# Patient Record
Sex: Male | Born: 2000 | Race: Black or African American | Hispanic: No | Marital: Single | Smoking: Never smoker
Health system: Southern US, Community
[De-identification: ages and names within clinical notes are randomized; demographics above are authoritative.]

## PROBLEM LIST (undated history)

## (undated) DIAGNOSIS — J45909 Unspecified asthma, uncomplicated: Secondary | ICD-10-CM

## (undated) HISTORY — PX: MENISCUS REPAIR: SHX5179

## (undated) HISTORY — PX: ANTERIOR CRUCIATE LIGAMENT REPAIR: SHX115

---

## 2021-08-06 ENCOUNTER — Emergency Department (HOSPITAL_BASED_OUTPATIENT_CLINIC_OR_DEPARTMENT_OTHER)
Admission: EM | Admit: 2021-08-06 | Discharge: 2021-08-06 | Disposition: A | Discharge status: Home / Self Care | Payer: Medicaid - Out of State | Attending: Emergency Medicine | Admitting: Emergency Medicine

## 2021-08-06 ENCOUNTER — Encounter (HOSPITAL_BASED_OUTPATIENT_CLINIC_OR_DEPARTMENT_OTHER): Payer: Self-pay | Admitting: Urology

## 2021-08-06 DIAGNOSIS — J45909 Unspecified asthma, uncomplicated: Secondary | ICD-10-CM | POA: Insufficient documentation

## 2021-08-06 DIAGNOSIS — H9201 Otalgia, right ear: Secondary | ICD-10-CM | POA: Diagnosis present

## 2021-08-06 DIAGNOSIS — R0981 Nasal congestion: Secondary | ICD-10-CM | POA: Insufficient documentation

## 2021-08-06 HISTORY — DX: Unspecified asthma, uncomplicated: J45.909

## 2021-08-06 MED ORDER — DEXAMETHASONE 4 MG PO TABS
10.0000 mg | ORAL_TABLET | Freq: Once | ORAL | Status: AC
  Administered 2021-08-06: 10 mg via ORAL

## 2021-08-06 NOTE — ED Provider Notes (Signed)
[facility] EMERGENCY DEPT Provider Note   CSN: [id number] Arrival date & time: 08/06/21  1903     History Chief Complaint  Patient presents with   Otalgia    Cory Meza. is a 20 y.o. male.  The history is provided by the patient.  Otalgia Location:  Right Behind ear:  No abnormality Quality:  Aching Severity:  Mild Timing:  Constant Chronicity:  New Context: not recent URI   Relieved by:  Nothing Worsened by:  Nothing Associated symptoms: congestion   Associated symptoms: no ear discharge, no fever, no headaches, no sore throat and no tinnitus       Past Medical History:  Diagnosis Date   Asthma     There are no problems to display for this patient.   History reviewed. No pertinent surgical history.     History reviewed. No pertinent family history.  Social History   Tobacco Use   Smoking status: Never   Smokeless tobacco: Never  Substance Use Topics   Alcohol use: Never   Drug use: Never    Home Medications Prior to Admission medications   Not on File    Allergies    Patient has no allergy information on record.  Review of Systems   Review of Systems  Constitutional:  Negative for fever.  HENT:  Positive for congestion, ear pain, sinus pressure and sinus pain. Negative for dental problem, drooling, ear discharge, facial swelling, sore throat, tinnitus and trouble swallowing.   Neurological:  Negative for headaches.   Physical Exam Updated Vital Signs BP (!) 152/72 (BP Location: Right Arm)   Pulse 89   Temp 98.8 F (37.1 C) (Oral)   Resp 18   Ht 5\' 7"  (1.702 m)   Wt 136.1 kg   SpO2 97%   BMI 46.99 kg/m   Physical Exam HENT:     Head: Normocephalic and atraumatic.     Right Ear: Tympanic membrane and external ear normal. There is no impacted cerumen.     Left Ear: Tympanic membrane and external ear normal. There is no impacted cerumen.     Nose: Congestion present.     Mouth/Throat:     Mouth: Mucous  membranes are moist.     Pharynx: No oropharyngeal exudate or posterior oropharyngeal erythema.  Eyes:     General:        Right eye: No discharge.        Left eye: No discharge.     Pupils: Pupils are equal, round, and reactive to light.  Musculoskeletal:     Cervical back: Normal range of motion.  Neurological:     Mental Status: He is alert.    ED Results / Procedures / Treatments   Labs (all labs ordered are listed, but only abnormal results are displayed) Labs Reviewed - No data to display  EKG None  Radiology No results found.  Procedures Procedures   Medications Ordered in ED Medications  dexamethasone (DECADRON) tablet 10 mg (10 mg Oral Given 08/06/21 2123)    ED Course  I have reviewed the triage vital signs and the nursing notes.  Pertinent labs & imaging results that were available during my care of the patient were reviewed by me and considered in my medical decision making (see chart for details).    MDM Rules/Calculators/A&P                           [place] [address]89  Jr. is here with right ear pain.  Normal vitals.  No fever.  Ear exam bilaterally is normal.  There is no signs of infection.  He has had some sinus congestion.  Overall suspect sinus congestion causing ear discomfort.  Gave a dose of Decadron.  Patient did not want COVID test.  Suspect either allergies versus viral process.  Suspect that this will get better with time.  Discharged in good condition.  Understands return precautions.  This chart was dictated using voice recognition software.  Despite best efforts to proofread,  errors can occur which can change the documentation meaning.   Final Clinical Impression(s) / ED Diagnoses Final diagnoses:  Right ear pain    Rx / DC Orders ED Discharge Orders     None        Virgina Norfolk, DO 08/06/21 2128

## 2021-08-06 NOTE — Discharge Instructions (Signed)
Overall I believe your ear pain is secondary to sinus congestion.  There is no evidence of ear infection or earwax on your exam.  I am hoping that steroid dose will help relieve the pressure in your eardrums.  You have been given a long-acting steroid that should help with your discomfort over the next several days.

## 2021-08-06 NOTE — ED Triage Notes (Signed)
Pt states right ear pain and fullness since yesterday.  Used OTC ear drops with no relief. Denies drainage

## 2021-12-04 ENCOUNTER — Emergency Department (HOSPITAL_COMMUNITY): Payer: Medicaid - Out of State

## 2021-12-04 ENCOUNTER — Emergency Department (HOSPITAL_COMMUNITY)
Admission: EM | Admit: 2021-12-04 | Discharge: 2021-12-05 | Disposition: A | Discharge status: Home / Self Care | Payer: Medicaid - Out of State | Attending: Emergency Medicine | Admitting: Emergency Medicine

## 2021-12-04 DIAGNOSIS — S161XXA Strain of muscle, fascia and tendon at neck level, initial encounter: Secondary | ICD-10-CM | POA: Diagnosis not present

## 2021-12-04 DIAGNOSIS — S0990XA Unspecified injury of head, initial encounter: Secondary | ICD-10-CM | POA: Diagnosis present

## 2021-12-04 DIAGNOSIS — S299XXA Unspecified injury of thorax, initial encounter: Secondary | ICD-10-CM | POA: Diagnosis not present

## 2021-12-04 DIAGNOSIS — G44319 Acute post-traumatic headache, not intractable: Secondary | ICD-10-CM | POA: Insufficient documentation

## 2021-12-04 DIAGNOSIS — Y92410 Unspecified street and highway as the place of occurrence of the external cause: Secondary | ICD-10-CM | POA: Diagnosis not present

## 2021-12-04 MED ORDER — ACETAMINOPHEN 325 MG PO TABS
650.0000 mg | ORAL_TABLET | Freq: Once | ORAL | Status: AC
  Administered 2021-12-05: 650 mg via ORAL
  Filled 2021-12-04: qty 2
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
## 2021-12-05 ENCOUNTER — Emergency Department (HOSPITAL_COMMUNITY): Payer: Medicaid - Out of State

## 2021-12-05 MED ORDER — METHOCARBAMOL 500 MG PO TABS: 500.0000 mg | ORAL_TABLET | Freq: Every evening | ORAL | 0 refills | Status: DC | PRN
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
## 2021-12-05 NOTE — Discharge Instructions (Signed)
Take ibuprofen 3 times a day with meals as needed for pain.  Do not take other anti-inflammatories at the same time (Advil, Motrin, naproxen, Aleve). You may supplement with Tylenol if you need further pain control. °Use robaxin as needed for muscle stiffness or soreness.  Have caution, this may make you tired or groggy.  Do not drive or operate heavy machinery while taking this medicine. °Use ice packs or heating pads if this helps control your pain. °You will likely have continued muscle stiffness and soreness over the next couple days.  Follow-up with primary care in 1 week if your symptoms are not improving. °Return to the emergency room if you develop vision changes, vomiting, slurred speech, numbness, loss of bowel or bladder control, or any new or worsening symptoms. ° °
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
## 2022-09-18 ENCOUNTER — Emergency Department (HOSPITAL_COMMUNITY)
Admission: EM | Admit: 2022-09-18 | Discharge: 2022-09-18 | Disposition: A | Discharge status: Home / Self Care | Payer: Medicaid - Out of State | Attending: Emergency Medicine | Admitting: Emergency Medicine

## 2022-09-18 ENCOUNTER — Other Ambulatory Visit: Payer: Self-pay

## 2022-09-18 ENCOUNTER — Encounter (HOSPITAL_COMMUNITY): Payer: Self-pay | Admitting: Emergency Medicine

## 2022-09-18 DIAGNOSIS — I10 Essential (primary) hypertension: Secondary | ICD-10-CM | POA: Insufficient documentation

## 2022-09-18 DIAGNOSIS — K1329 Other disturbances of oral epithelium, including tongue: Secondary | ICD-10-CM | POA: Insufficient documentation

## 2022-09-18 DIAGNOSIS — G4489 Other headache syndrome: Secondary | ICD-10-CM | POA: Insufficient documentation

## 2022-09-18 LAB — I-STAT CHEM 8, ED
BUN: 12 mg/dL (ref 6–20)
Calcium, Ion: 1.23 mmol/L (ref 1.15–1.40)
Chloride: 104 mmol/L (ref 98–111)
Creatinine, Ser: 0.8 mg/dL (ref 0.61–1.24)
Glucose, Bld: 104 mg/dL — ABNORMAL HIGH (ref 70–99)
HCT: 43 % (ref 39.0–52.0)
Hemoglobin: 14.6 g/dL (ref 13.0–17.0)
Potassium: 4.3 mmol/L (ref 3.5–5.1)
Sodium: 139 mmol/L (ref 135–145)
TCO2: 27 mmol/L (ref 22–32)

## 2022-09-18 MED ORDER — PROCHLORPERAZINE EDISYLATE 10 MG/2ML IJ SOLN
10.0000 mg | Freq: Once | INTRAMUSCULAR | Status: AC
  Administered 2022-09-18: 10 mg via INTRAVENOUS
  Filled 2022-09-18: qty 2

## 2022-09-18 MED ORDER — KETOROLAC TROMETHAMINE 15 MG/ML IJ SOLN
15.0000 mg | Freq: Once | INTRAMUSCULAR | Status: AC
  Administered 2022-09-18: 15 mg via INTRAVENOUS
  Filled 2022-09-18: qty 1

## 2022-09-18 NOTE — ED Provider Notes (Signed)
[facility] [place] [facility]EMERGENCY DEPT Provider Note   CSN: [id number] Arrival date & time: 09/18/22  0559     History  Chief Complaint  Patient presents with   Headache    Taiten T Nefi Musich. is a 21 y.o. male.  The history is provided by the patient.  Patient is a 21 year old male who presents with headache.  Patient reports for the past several months he has frequent headaches.  Reports it typically occurs in the morning and gets improved throughout the day.  No fevers or vomiting.  No new visual changes.  No focal weakness.  No recent head trauma.  He reports when he has a headache he has tongue numbness each time.  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  He reports he is under stress at school and may not be sleeping enough.  Denies any drug or alcohol abuse.  No known family history of CVA or aneurysm.      Home Medications Prior to Admission medications   Not on File      Allergies    Patient has no allergy information on record.    Review of Systems   Review of Systems  Constitutional:  Negative for fever.  Eyes:  Negative for visual disturbance.  Respiratory:  Negative for shortness of breath.   Cardiovascular:  Negative for chest pain.  Neurological:  Positive for headaches. Negative for syncope and weakness.    Physical Exam Updated Vital Signs BP (!) 177/109 (BP Location: Left Arm)   Pulse (!) 103   Temp 98.2 F (36.8 C) (Oral)   Resp 16   Ht 1.702 m (5\' 7" )   Wt (!) 165.6 kg   SpO2 97%   BMI 57.17 kg/m  Physical Exam CONSTITUTIONAL: Well developed/well nourished HEAD: Normocephalic/atraumatic EYES: EOMI/PERRL, no nystagmus, no ptosis ENMT: Mucous membranes moist NECK: supple no meningeal signs SPINE/BACK:entire spine nontender CV: S1/S2 noted, no murmurs/rubs/gallops noted LUNGS: Lungs are clear to auscultation bilaterally, no apparent distress ABDOMEN: soft, nontender, no rebound or guarding GU:no cva  tenderness NEURO:Awake/alert, face symmetric, no arm or leg drift is noted Equal 5/5 strength with shoulder abduction, elbow flex/extension, wrist flex/extension in upper extremities and equal hand grips bilaterally Equal 5/5 strength with hip flexion,knee flex/extension, foot dorsi/plantar flexion Cranial nerves 3/4/5/6/05/31/09/11/12 tested and intact Gait normal without ataxia No past pointing Sensation to light touch intact in all extremities EXTREMITIES: pulses normal, full ROM SKIN: warm, color normal PSYCH: no abnormalities of mood noted, alert and oriented to situation  ED Results / Procedures / Treatments   Labs (all labs ordered are listed, but only abnormal results are displayed) Labs Reviewed  I-STAT CHEM 8, ED - Abnormal; Notable for the following components:      Result Value   Glucose, Bld 104 (*)    All other components within normal limits    EKG None  Radiology No results found.  Procedures Procedures    Medications Ordered in ED Medications  prochlorperazine (COMPAZINE) injection 10 mg (10 mg Intravenous Given 09/18/22 0635)  ketorolac (TORADOL) 15 MG/ML injection 15 mg (15 mg Intravenous Given 09/18/22 09/20/22)    ED Course/ Medical Decision Making/ A&P Clinical Course as of 09/18/22 0655  Fri Sep 18, 2022  0645 Patient reports intermittent headaches for a while, usually happens at night and improves.  Patient is hypertensive here, and when I review his chart most of his ED visits he is typically hypertensive.  This is likely contributing to  his headaches.  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  He already had neuroimaging earlier this year that was unremarkable. [DW]  [phone] Low suspicion  for CVA or other acute neurologic emergency.  We will treat his headache and reassess [DW]  0653 Patient's blood pressure is improving.  His headache is resolved.  He declines blood pressure medicine at this time.  I  strongly encouraged him to follow-up closely to have this rechecked as he will likely need to be on daily medications. [DW]  [phone] We discussed strict return precautions [DW]    Clinical Course User Index [DW] Ripley Fraise, MD                           Medical Decision Making Risk Prescription drug management.   This patient presents to the ED for concern of headache, this involves an extensive number of treatment options, and is a complaint that carries with it a high risk of complications and morbidity.  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  The pertinent results include:  labs are unremarkable  Medicines ordered and prescription drug management: I ordered medication including Compazine and Toradol for headache Reevaluation of the patient after these medicines showed that the patient    improved  Test Considered: I consider neuroimaging however patient has had CT head in the past year that was negative.  Reevaluation: After the interventions noted above, I reevaluated the patient and found that they have :improved  Complexity of problems addressed: Patient's presentation is most consistent with  acute presentation with potential threat to life or bodily function  Disposition: After consideration of the diagnostic results and the patient's response to treatment,  I feel that the  patent would benefit from discharge   .           Final Clinical Impression(s) / ED Diagnoses Final diagnoses:  Other headache syndrome  Uncontrolled hypertension    Rx / DC Orders ED Discharge Orders     None         Ripley Fraise, MD 09/18/22 [phone]

## 2022-09-18 NOTE — Discharge Instructions (Signed)

## 2022-09-18 NOTE — ED Triage Notes (Signed)
Pt c/o headache since 0300 this morning. Pt states that his tongue is numb.

## 2022-12-07 ENCOUNTER — Emergency Department (HOSPITAL_COMMUNITY)
Admission: EM | Admit: 2022-12-07 | Discharge: 2022-12-07 | Disposition: A | Discharge status: Home / Self Care | Payer: Self-pay | Attending: Emergency Medicine | Admitting: Emergency Medicine

## 2022-12-07 ENCOUNTER — Encounter (HOSPITAL_COMMUNITY): Payer: Self-pay

## 2022-12-07 ENCOUNTER — Other Ambulatory Visit: Payer: Self-pay

## 2022-12-07 DIAGNOSIS — J020 Streptococcal pharyngitis: Secondary | ICD-10-CM | POA: Insufficient documentation

## 2022-12-07 DIAGNOSIS — Z1152 Encounter for screening for COVID-19: Secondary | ICD-10-CM | POA: Insufficient documentation

## 2022-12-07 LAB — RESP PANEL BY RT-PCR (RSV, FLU A&B, COVID)  RVPGX2
Influenza A by PCR: NEGATIVE
Influenza B by PCR: NEGATIVE
Resp Syncytial Virus by PCR: NEGATIVE
SARS Coronavirus 2 by RT PCR: NEGATIVE

## 2022-12-07 LAB — GROUP A STREP BY PCR: Group A Strep by PCR: DETECTED — AB

## 2022-12-07 MED ORDER — HYDROCODONE-ACETAMINOPHEN 7.5-325 MG/15ML PO SOLN: ORAL | 0 refills | Status: AC

## 2022-12-07 MED ORDER — PENICILLIN V POTASSIUM 500 MG PO TABS: 500.0000 mg | ORAL_TABLET | Freq: Four times a day (QID) | ORAL | 0 refills | Status: AC

## 2022-12-07 MED ORDER — ACETAMINOPHEN 325 MG PO TABS
650.0000 mg | ORAL_TABLET | Freq: Once | ORAL | Status: AC
  Administered 2022-12-07: 650 mg via ORAL
  Filled 2022-12-07: qty 2

## 2022-12-07 MED ORDER — LIDOCAINE VISCOUS HCL 2 % MT SOLN
15.0000 mL | Freq: Once | OROMUCOSAL | Status: AC
  Administered 2022-12-07: 15 mL via OROMUCOSAL
  Filled 2022-12-07: qty 15

## 2022-12-07 NOTE — Discharge Instructions (Signed)
Drink plenty of fluids.  Follow-up with your family doctor if not improving.  Have your blood pressure checked in the next couple weeks because it is mildly elevated but this could be related to your infection.

## 2022-12-07 NOTE — ED Provider Notes (Signed)
[place] [facility] DEPT Provider Note   CSN: [id number] Arrival date & time: 12/07/22  0751     History  Chief Complaint  Patient presents with   Sore Throat    Cory Meza. is a 22 y.o. male.  Patient complains of a sore throat.  No past medical history  The history is provided by the patient and medical records. No language interpreter was used.  Sore Throat This is a new problem. The problem occurs constantly. The problem has not changed since onset.Pertinent negatives include no chest pain, no abdominal pain and no headaches. Nothing aggravates the symptoms. Nothing relieves the symptoms. He has tried nothing for the symptoms. The treatment provided no relief.       Home Medications Prior to Admission medications   Medication Sig Start Date End Date Taking? Authorizing Provider  HYDROcodone-acetaminophen (HYCET) 7.5-325 mg/15 ml solution 1 teaspoon every 6 hours for severe pain that is not relieved by Tylenol or Motrin alone 12/07/22  Yes Milton Ferguson, MD  penicillin v potassium (VEETID) 500 MG tablet Take 1 tablet (500 mg total) by mouth 4 (four) times daily for 7 days. 12/07/22 12/14/22 Yes Milton Ferguson, MD      Allergies    Patient has no known allergies.    Review of Systems   Review of Systems  Constitutional:  Negative for appetite change and fatigue.  HENT:  Positive for sore throat. Negative for congestion, ear discharge and sinus pressure.   Eyes:  Negative for discharge.  Respiratory:  Negative for cough.   Cardiovascular:  Negative for chest pain.  Gastrointestinal:  Negative for abdominal pain and diarrhea.  Genitourinary:  Negative for frequency and hematuria.  Musculoskeletal:  Negative for back pain.  Skin:  Negative for rash.  Neurological:  Negative for seizures and headaches.  Psychiatric/Behavioral:  Negative for hallucinations.     Physical Exam Updated Vital Signs BP (!) 155/89   Pulse 96   Temp 97.7 F  (36.5 C) (Oral)   Resp 19   Ht 5\' 7"  (1.702 m)   Wt (!) 163.3 kg   SpO2 96%   BMI 56.38 kg/m  Physical Exam Vitals and nursing note reviewed.  Constitutional:      Appearance: He is well-developed.  HENT:     Head: Normocephalic.     Nose: Nose normal.     Mouth/Throat:     Comments: Pharynx inflamed Eyes:     General: No scleral icterus.    Conjunctiva/sclera: Conjunctivae normal.  Neck:     Thyroid: No thyromegaly.  Cardiovascular:     Rate and Rhythm: Normal rate and regular rhythm.     Heart sounds: No murmur heard.    No friction rub. No gallop.  Pulmonary:     Breath sounds: No stridor. No wheezing or rales.  Chest:     Chest wall: No tenderness.  Abdominal:     General: There is no distension.     Tenderness: There is no abdominal tenderness. There is no rebound.  Musculoskeletal:        General: Normal range of motion.     Cervical back: Neck supple.  Lymphadenopathy:     Cervical: No cervical adenopathy.  Skin:    Findings: No erythema or rash.  Neurological:     Mental Status: He is alert and oriented to person, place, and time.     Motor: No abnormal muscle tone.     Coordination: Coordination normal.  Psychiatric:  Behavior: Behavior normal.     ED Results / Procedures / Treatments   Labs (all labs ordered are listed, but only abnormal results are displayed) Labs Reviewed  GROUP A STREP BY PCR - Abnormal; Notable for the following components:      Result Value   Group A Strep by PCR DETECTED (*)    All other components within normal limits  RESP PANEL BY RT-PCR (RSV, FLU A&B, COVID)  [id number]    EKG None  Radiology No results found.  Procedures Procedures    Medications Ordered in ED Medications  lidocaine (XYLOCAINE) 2 % viscous mouth solution 15 mL (15 mLs Mouth/Throat Given 12/07/22 0805)  acetaminophen (TYLENOL) tablet 650 mg (650 mg Oral Given 12/07/22 0805)    ED Course/ Medical Decision Making/ A&P                              Medical Decision Making Risk Prescription drug management.  This patient presents to the ED for concern of sore throat, this involves an extensive number of treatment options, and is a complaint that carries with it a high risk of complications and morbidity.  The differential diagnosis includes strep pharyngitis, viral pharyngitis   Co morbidities that complicate the patient evaluation  None   Additional history obtained:  Additional history obtained from patient External records from outside source obtained and reviewed including hospital records   Lab Tests:  I Ordered, and personally interpreted labs.  The pertinent results include: Strep test positive, COVID and flu negative   Imaging Studies ordered:  No imaging  Cardiac Monitoring: / EKG:  The patient was maintained on a cardiac monitor.  I personally viewed and interpreted the cardiac monitored which showed an underlying rhythm of: Normal sinus rhythm   Consultations Obtained: No consultant  Problem List / ED Course / Critical interventions / Medication management  Strep throat I ordered medication including penicillin for strep throat Reevaluation of the patient after these medicines showed that the patient stayed the same I have reviewed the patients home medicines and have made adjustments as needed   Social Determinants of Health:  None   Test / Admission - Considered:  None  Patient with strep pharyngitis.  He is given penicillin and Lortab and will follow-up as needed.  His blood pressure is mildly elevated and will be rechecked in couple weeks        Final Clinical Impression(s) / ED Diagnoses Final diagnoses:  None    Rx / DC Orders ED Discharge Orders          Ordered    penicillin v potassium (VEETID) 500 MG tablet  4 times daily        12/07/22 0920    HYDROcodone-acetaminophen (HYCET) 7.5-325 mg/15 ml solution        12/07/22 0920              Milton Ferguson, MD 12/09/22 [phone]

## 2022-12-07 NOTE — ED Provider Triage Note (Signed)
Emergency Medicine Provider Triage Evaluation Note  Cory Meza. , a 22 y.o. male  was evaluated in triage.  Pt complains of having cold symptoms including runny nose, fatigue for the last 3 weeks. Woke up this AM with severe sore throat.  Review of Systems  Positive: Sore throat Negative: fever  Physical Exam  BP (!) 155/89   Pulse 96   Temp 97.7 F (36.5 C) (Oral)   Resp 19   Ht 5\' 7"  (1.702 m)   Wt (!) 163.3 kg   SpO2 96%   BMI 56.38 kg/m  Gen:   Awake, no distress   Resp:  Normal effort  MSK:   Moves extremities without difficulty Other:  2+ tonsils w/erythema, no muffled voice  Medical Decision Making  Medically screening exam initiated at 7:58 AM.  Appropriate orders placed.  [name] was informed that the remainder of the evaluation will be completed by another provider, this initial triage assessment does not replace that evaluation, and the importance of remaining in the ED until their evaluation is complete.    [name], [place] 12/07/22 [phone]

## 2022-12-07 NOTE — ED Triage Notes (Signed)
Patient said when he swallows it hurts. He has had a cold for 3 weeks.

## 2023-03-11 ENCOUNTER — Ambulatory Visit
Admission: EM | Admit: 2023-03-11 | Discharge: 2023-03-11 | Disposition: A | Discharge status: Home / Self Care | Payer: Self-pay | Attending: Urgent Care | Admitting: Urgent Care

## 2023-03-11 DIAGNOSIS — H109 Unspecified conjunctivitis: Secondary | ICD-10-CM

## 2023-03-11 MED ORDER — OLOPATADINE HCL 0.1 % OP SOLN: 1.0000 [drp] | Freq: Two times a day (BID) | OPHTHALMIC | 0 refills | Status: AC

## 2023-03-11 MED ORDER — TOBRAMYCIN 0.3 % OP SOLN: 1.0000 [drp] | OPHTHALMIC | 0 refills | Status: AC

## 2023-03-11 NOTE — ED Provider Notes (Signed)
  [facility]  Note:  This document was prepared using Conservation officer, historic buildings and may include unintentional dictation errors.  MRN: [id number] DOB: 10-Jan-2001  Subjective:   Cory Meza. is a 22 y.o. male presenting for 1 day history of acute onset persistent right eye redness, irritation, watering, blurred vision, crusting of the eyelids/eyelashes.  No eye trauma.  No contact lens use.  Symptoms started after he slept on his carpeted floor.  No current facility-administered medications for this encounter.  Current Outpatient Medications:    HYDROcodone-acetaminophen (HYCET) 7.5-325 mg/15 ml solution, 1 teaspoon every 6 hours for severe pain that is not relieved by Tylenol or Motrin alone, Disp: 118 mL, Rfl: 0   No Known Allergies  Past Medical History:  Diagnosis Date   Asthma      History reviewed. No pertinent surgical history.  History reviewed. No pertinent family history.  Social History   Tobacco Use   Smoking status: Never   Smokeless tobacco: Never  Substance Use Topics   Alcohol use: Never   Drug use: Never    ROS   Objective:   Vitals: BP 138/82 (BP Location: Right Arm)   Pulse (!) 101   Temp 97.9 F (36.6 C) (Oral)   Resp 15   SpO2 94%   Visual Acuity Right Eye Distance: 20/20 Left Eye Distance: 20/20 Bilateral Distance: 20/20  Right Eye Near:   Left Eye Near:    Bilateral Near:     Physical Exam Constitutional:      General: He is not in acute distress.    Appearance: Normal appearance. He is well-developed and normal weight. He is not ill-appearing, toxic-appearing or diaphoretic.  HENT:     Head: Normocephalic and atraumatic.     Right Ear: External ear normal.     Left Ear: External ear normal.     Nose: Nose normal.     Mouth/Throat:     Pharynx: Oropharynx is clear.  Eyes:     General: Lids are everted, no foreign bodies appreciated. No scleral icterus.       Right eye: No foreign  body, discharge or hordeolum.        Left eye: No foreign body, discharge or hordeolum.     Extraocular Movements: Extraocular movements intact.     Conjunctiva/sclera:     Right eye: Right conjunctiva is injected. No chemosis, exudate or hemorrhage.    Left eye: Left conjunctiva is not injected. No chemosis, exudate or hemorrhage. Cardiovascular:     Rate and Rhythm: Normal rate.  Pulmonary:     Effort: Pulmonary effort is normal.  Musculoskeletal:     Cervical back: Normal range of motion.  Neurological:     Mental Status: He is alert and oriented to person, place, and time.  Psychiatric:        Mood and Affect: Mood normal.        Behavior: Behavior normal.        Thought Content: Thought content normal.        Judgment: Judgment normal.     Assessment and Plan :   PDMP not reviewed this encounter.  1. Bacterial conjunctivitis of right eye    Will start tobramycin to address bacterial conjunctivitis of right eye. Counseled patient on potential for adverse effects with medications prescribed/recommended today, ER and return-to-clinic precautions discussed, patient verbalized understanding.    Wallis Bamberg, [place] 03/11/23 1536

## 2023-03-11 NOTE — Discharge Instructions (Addendum)
Use tobramycin eye drops for the infection a total of 1 week. Use olopatadine eye drops for itching as needed.

## 2023-03-11 NOTE — ED Triage Notes (Addendum)
Pt c/o eye drainage, pain, blurred vision, tightness, crusting over. X 1 day, pt is unsure of what may have caused sx   Pt has tried eye drops, they have not helped.

## 2023-07-18 ENCOUNTER — Ambulatory Visit
Admission: EM | Admit: 2023-07-18 | Discharge: 2023-07-18 | Disposition: A | Discharge status: Home / Self Care | Payer: BC Managed Care – PPO | Attending: Internal Medicine | Admitting: Internal Medicine

## 2023-07-18 ENCOUNTER — Encounter: Payer: Self-pay | Admitting: Emergency Medicine

## 2023-07-18 DIAGNOSIS — Z113 Encounter for screening for infections with a predominantly sexual mode of transmission: Secondary | ICD-10-CM | POA: Diagnosis not present

## 2023-07-18 DIAGNOSIS — J4521 Mild intermittent asthma with (acute) exacerbation: Secondary | ICD-10-CM | POA: Diagnosis not present

## 2023-07-18 MED ORDER — ALBUTEROL SULFATE HFA 108 (90 BASE) MCG/ACT IN AERS: 1.0000 | INHALATION_SPRAY | Freq: Four times a day (QID) | RESPIRATORY_TRACT | 1 refills | Status: AC | PRN

## 2023-07-18 MED ORDER — PREDNISONE 20 MG PO TABS: 40.0000 mg | ORAL_TABLET | Freq: Every day | ORAL | 0 refills | Status: AC

## 2023-07-18 NOTE — ED Triage Notes (Addendum)
Pt c/o SOB and chest pain due to asthma onset yesterday. He states that his roommate smokes and he believes it triggered it. Pt doesn't have an inhaler currently or his nebulizer machine.   Pt would also like STD screening. Denies any sx. Would like HIV to be included.

## 2023-07-18 NOTE — Discharge Instructions (Addendum)
I sent you an albuterol inhaler to use as you need to for your asthma.  May start prednisone daily for 5 days.  Highly encourage you establish with a PCP for further management and monitoring of your asthma.  The clinic will contact you for any results of the STD testing done today if positive.  Please go to the ER for any worsening symptoms.  I hope you feel better soon!

## 2023-07-18 NOTE — ED Provider Notes (Addendum)
[facility]    CSN: [id number] Arrival date & time: 07/18/23  1457      History   Chief Complaint Chief Complaint  Patient presents with  . Asthma  . SEXUALLY TRANSMITTED DISEASE    HPI Cory Meza. is a 22 y.o. male presents for asthma symptoms.  Patient reports he recently moved into a new house and roommate does smoke marijuana.  He believes this in addition to moving aggravated asthma.  He endorses some shortness of breath with chest tightness.  Denies any wheezing, fevers, URI symptoms.  He does not currently have an inhaler or nebulizer.  Denies any hospitalizations for his asthma in the past year.  In addition he would like STD screening.  Denies any current symptoms including penile discharge, testicular pain or swelling.  No known STD exposure.  No other concerns at this time.   Asthma Associated symptoms include shortness of breath.    Past Medical History:  Diagnosis Date  . Asthma     There are no problems to display for this patient.   Past Surgical History:  Procedure Laterality Date  . ANTERIOR CRUCIATE LIGAMENT REPAIR    . MENISCUS REPAIR         Home Medications    Prior to Admission medications   Medication Sig Start Date End Date Taking? Authorizing Provider  albuterol (VENTOLIN HFA) 108 (90 Base) MCG/ACT inhaler Inhale 1-2 puffs into the lungs every 6 (six) hours as needed for wheezing or shortness of breath. 07/18/23  Yes Radford Pax, NP  predniSONE (DELTASONE) 20 MG tablet Take 2 tablets (40 mg total) by mouth daily with breakfast for 5 days. 07/18/23 07/23/23 Yes Radford Pax, NP  HYDROcodone-acetaminophen (HYCET) 7.5-325 mg/15 ml solution 1 teaspoon every 6 hours for severe pain that is not relieved by Tylenol or Motrin alone 12/07/22   Bethann Berkshire, MD  olopatadine (PATANOL) 0.1 % ophthalmic solution Place 1 drop into the right eye 2 (two) times daily. 03/11/23   Wallis Bamberg, PA-C  tobramycin (TOBREX) 0.3 % ophthalmic  solution Place 1 drop into the right eye every 4 (four) hours. 03/11/23   Wallis Bamberg, PA-C    Family History No family history on file.  Social History Social History   Tobacco Use  . Smoking status: Never  . Smokeless tobacco: Never  Substance Use Topics  . Alcohol use: Yes    Comment: occasional  . Drug use: Never     Allergies   Patient has no known allergies.   Review of Systems Review of Systems  Respiratory:  Positive for chest tightness and shortness of breath.   Genitourinary:        STD screening     Physical Exam Triage Vital Signs ED Triage Vitals  Encounter Vitals Group     BP 07/18/23 1518 (!) 145/81     Systolic BP Percentile --      Diastolic BP Percentile --      Pulse Rate 07/18/23 1518 (!) 110     Resp 07/18/23 1518 17     Temp 07/18/23 1518 99.1 F (37.3 C)     Temp Source 07/18/23 1518 Oral     SpO2 07/18/23 1518 98 %     Weight --      Height --      Head Circumference --      Peak Flow --      Pain Score 07/18/23 1516 0     Pain Loc --  Pain Education --      Exclude from Growth Chart --    No data found.  Updated Vital Signs BP (!) 145/81 (BP Location: Left Arm)   Pulse (!) 110   Temp 99.1 F (37.3 C) (Oral)   Resp 17   SpO2 98%   Visual Acuity Right Eye Distance:   Left Eye Distance:   Bilateral Distance:    Right Eye Near:   Left Eye Near:    Bilateral Near:     Physical Exam Vitals and nursing note reviewed.  Constitutional:      General: He is not in acute distress.    Appearance: Normal appearance. He is not ill-appearing or toxic-appearing.  HENT:     Head: Normocephalic and atraumatic.     Right Ear: Tympanic membrane and ear canal normal.     Left Ear: Tympanic membrane and ear canal normal.     Mouth/Throat:     Mouth: Mucous membranes are moist.     Pharynx: No posterior oropharyngeal erythema.  Eyes:     Pupils: Pupils are equal, round, and reactive to light.  Cardiovascular:     Rate and  Rhythm: Normal rate and regular rhythm.     Heart sounds: Normal heart sounds.  Pulmonary:     Effort: Pulmonary effort is normal. No respiratory distress.     Breath sounds: Normal breath sounds. No wheezing.  Musculoskeletal:     Cervical back: Normal range of motion and neck supple.  Lymphadenopathy:     Cervical: No cervical adenopathy.  Skin:    General: Skin is warm and dry.  Neurological:     General: No focal deficit present.     Mental Status: He is alert and oriented to person, place, and time.  Psychiatric:        Mood and Affect: Mood normal.        Behavior: Behavior normal.      UC Treatments / Results  Labs (all labs ordered are listed, but only abnormal results are displayed) Labs Reviewed  RPR  HIV ANTIBODY (ROUTINE TESTING W REFLEX)  CYTOLOGY, (ORAL, ANAL, URETHRAL) ANCILLARY ONLY    EKG   Radiology No results found.  Procedures Procedures (including critical care time)  Medications Ordered in UC Medications - No data to display  Initial Impression / Assessment and Plan / UC Course  I have reviewed the triage vital signs and the nursing notes.  Pertinent labs & imaging results that were available during my care of the patient were reviewed by me and considered in my medical decision making (see chart for details).  Clinical Course as of 07/18/23 1532  Sun Jul 18, 2023  1532 HR recheck 95 [JM]    Clinical Course User Index [JM] Radford Pax, NP    Reviewed exam and symptoms with patient.  He is in no acute distress and well-appearing.  No active wheezing on exam.  I did refill his albuterol inhaler and will also start prednisone daily for 5 days.  Strongly encouraged she establish with a PCP for asthma maintenance/treatment.  STD testing is ordered and will contact for any positive results.  ER precautions reviewed and patient verbalized understanding. Final Clinical Impressions(s) / UC Diagnoses   Final diagnoses:  Screening examination  for STD (sexually transmitted disease)  Mild intermittent asthma with acute exacerbation     Discharge Instructions      I sent you an albuterol inhaler to use as you need to for your  asthma.  May start prednisone daily for 5 days.  Highly encourage you establish with a PCP for further management and monitoring of your asthma.  The clinic will contact you for any results of the STD testing done today if positive.  Please go to the ER for any worsening symptoms.  I hope you feel better soon!    ED Prescriptions     Medication Sig Dispense Auth. Provider   albuterol (VENTOLIN HFA) 108 (90 Base) MCG/ACT inhaler Inhale 1-2 puffs into the lungs every 6 (six) hours as needed for wheezing or shortness of breath. 1 each Radford Pax, NP   predniSONE (DELTASONE) 20 MG tablet Take 2 tablets (40 mg total) by mouth daily with breakfast for 5 days. 10 tablet Radford Pax, NP      PDMP not reviewed this encounter.   Radford Pax, NP 07/18/23 1530    Radford Pax, NP 07/18/23 [phone]

## 2023-07-19 LAB — CYTOLOGY, (ORAL, ANAL, URETHRAL) ANCILLARY ONLY
Chlamydia: NEGATIVE
Comment: NEGATIVE
Comment: NEGATIVE
Comment: NORMAL
Neisseria Gonorrhea: NEGATIVE
Trichomonas: NEGATIVE

## 2023-07-20 LAB — HIV ANTIBODY (ROUTINE TESTING W REFLEX): HIV Screen 4th Generation wRfx: NONREACTIVE

## 2023-07-20 LAB — RPR: RPR Ser Ql: NONREACTIVE

## 2023-10-11 DIAGNOSIS — Z7722 Contact with and (suspected) exposure to environmental tobacco smoke (acute) (chronic): Secondary | ICD-10-CM | POA: Diagnosis not present

## 2023-10-11 DIAGNOSIS — L309 Dermatitis, unspecified: Secondary | ICD-10-CM | POA: Diagnosis not present

## 2023-10-11 DIAGNOSIS — Z23 Encounter for immunization: Secondary | ICD-10-CM | POA: Diagnosis not present

## 2023-10-11 DIAGNOSIS — Z0001 Encounter for general adult medical examination with abnormal findings: Secondary | ICD-10-CM | POA: Diagnosis not present

## 2023-10-11 DIAGNOSIS — R0683 Snoring: Secondary | ICD-10-CM | POA: Diagnosis not present

## 2023-10-11 DIAGNOSIS — J4520 Mild intermittent asthma, uncomplicated: Secondary | ICD-10-CM | POA: Diagnosis not present

## 2023-10-11 DIAGNOSIS — J302 Other seasonal allergic rhinitis: Secondary | ICD-10-CM | POA: Diagnosis not present

## 2023-10-11 DIAGNOSIS — Z131 Encounter for screening for diabetes mellitus: Secondary | ICD-10-CM | POA: Diagnosis not present

## 2023-10-14 DIAGNOSIS — Z0000 Encounter for general adult medical examination without abnormal findings: Secondary | ICD-10-CM | POA: Diagnosis not present

## 2023-10-27 DIAGNOSIS — J4521 Mild intermittent asthma with (acute) exacerbation: Secondary | ICD-10-CM | POA: Diagnosis not present

## 2023-11-01 DIAGNOSIS — J302 Other seasonal allergic rhinitis: Secondary | ICD-10-CM | POA: Diagnosis not present

## 2023-11-01 DIAGNOSIS — R7303 Prediabetes: Secondary | ICD-10-CM | POA: Diagnosis not present

## 2023-11-01 DIAGNOSIS — J4520 Mild intermittent asthma, uncomplicated: Secondary | ICD-10-CM | POA: Diagnosis not present

## 2023-11-26 DIAGNOSIS — M79602 Pain in left arm: Secondary | ICD-10-CM | POA: Diagnosis not present

## 2023-11-26 DIAGNOSIS — M25532 Pain in left wrist: Secondary | ICD-10-CM | POA: Diagnosis not present

## 2023-11-26 DIAGNOSIS — S52612A Displaced fracture of left ulna styloid process, initial encounter for closed fracture: Secondary | ICD-10-CM | POA: Diagnosis not present

## 2023-11-26 DIAGNOSIS — S52572A Other intraarticular fracture of lower end of left radius, initial encounter for closed fracture: Secondary | ICD-10-CM | POA: Diagnosis not present

## 2023-11-26 DIAGNOSIS — W000XXA Fall on same level due to ice and snow, initial encounter: Secondary | ICD-10-CM | POA: Diagnosis not present

## 2023-11-29 DIAGNOSIS — S52572A Other intraarticular fracture of lower end of left radius, initial encounter for closed fracture: Secondary | ICD-10-CM | POA: Diagnosis not present

## 2023-12-06 DIAGNOSIS — S52612A Displaced fracture of left ulna styloid process, initial encounter for closed fracture: Secondary | ICD-10-CM | POA: Diagnosis not present

## 2023-12-06 DIAGNOSIS — S52572A Other intraarticular fracture of lower end of left radius, initial encounter for closed fracture: Secondary | ICD-10-CM | POA: Diagnosis not present

## 2023-12-06 DIAGNOSIS — W000XXA Fall on same level due to ice and snow, initial encounter: Secondary | ICD-10-CM | POA: Diagnosis not present

## 2023-12-06 DIAGNOSIS — Z6841 Body Mass Index (BMI) 40.0 and over, adult: Secondary | ICD-10-CM | POA: Diagnosis not present

## 2023-12-06 DIAGNOSIS — J45909 Unspecified asthma, uncomplicated: Secondary | ICD-10-CM | POA: Diagnosis not present

## 2023-12-06 DIAGNOSIS — G8918 Other acute postprocedural pain: Secondary | ICD-10-CM | POA: Diagnosis not present

## 2023-12-13 ENCOUNTER — Encounter (HOSPITAL_BASED_OUTPATIENT_CLINIC_OR_DEPARTMENT_OTHER): Payer: Self-pay | Admitting: Internal Medicine

## 2023-12-13 DIAGNOSIS — G4733 Obstructive sleep apnea (adult) (pediatric): Secondary | ICD-10-CM

## 2023-12-13 DIAGNOSIS — N481 Balanitis: Secondary | ICD-10-CM | POA: Diagnosis not present

## 2023-12-13 DIAGNOSIS — Z202 Contact with and (suspected) exposure to infections with a predominantly sexual mode of transmission: Secondary | ICD-10-CM | POA: Diagnosis not present

## 2023-12-13 DIAGNOSIS — R300 Dysuria: Secondary | ICD-10-CM | POA: Diagnosis not present

## 2023-12-14 DIAGNOSIS — Z4889 Encounter for other specified surgical aftercare: Secondary | ICD-10-CM | POA: Diagnosis not present

## 2023-12-14 DIAGNOSIS — S52572D Other intraarticular fracture of lower end of left radius, subsequent encounter for closed fracture with routine healing: Secondary | ICD-10-CM | POA: Diagnosis not present

## 2023-12-31 DIAGNOSIS — M25642 Stiffness of left hand, not elsewhere classified: Secondary | ICD-10-CM | POA: Diagnosis not present

## 2024-01-03 DIAGNOSIS — M25642 Stiffness of left hand, not elsewhere classified: Secondary | ICD-10-CM | POA: Diagnosis not present

## 2024-01-06 DIAGNOSIS — M25642 Stiffness of left hand, not elsewhere classified: Secondary | ICD-10-CM | POA: Diagnosis not present

## 2024-01-11 DIAGNOSIS — M25642 Stiffness of left hand, not elsewhere classified: Secondary | ICD-10-CM | POA: Diagnosis not present

## 2024-01-14 DIAGNOSIS — M25642 Stiffness of left hand, not elsewhere classified: Secondary | ICD-10-CM | POA: Diagnosis not present

## 2024-01-18 DIAGNOSIS — M25642 Stiffness of left hand, not elsewhere classified: Secondary | ICD-10-CM | POA: Diagnosis not present

## 2024-01-21 DIAGNOSIS — M25642 Stiffness of left hand, not elsewhere classified: Secondary | ICD-10-CM | POA: Diagnosis not present

## 2024-01-21 DIAGNOSIS — J302 Other seasonal allergic rhinitis: Secondary | ICD-10-CM | POA: Diagnosis not present

## 2024-01-21 DIAGNOSIS — S6991XD Unspecified injury of right wrist, hand and finger(s), subsequent encounter: Secondary | ICD-10-CM | POA: Diagnosis not present

## 2024-01-21 DIAGNOSIS — J4520 Mild intermittent asthma, uncomplicated: Secondary | ICD-10-CM | POA: Diagnosis not present

## 2024-01-25 DIAGNOSIS — M25642 Stiffness of left hand, not elsewhere classified: Secondary | ICD-10-CM | POA: Diagnosis not present

## 2024-01-28 DIAGNOSIS — M25642 Stiffness of left hand, not elsewhere classified: Secondary | ICD-10-CM | POA: Diagnosis not present

## 2024-02-01 DIAGNOSIS — M25642 Stiffness of left hand, not elsewhere classified: Secondary | ICD-10-CM | POA: Diagnosis not present

## 2024-02-04 DIAGNOSIS — M25642 Stiffness of left hand, not elsewhere classified: Secondary | ICD-10-CM | POA: Diagnosis not present

## 2024-02-08 DIAGNOSIS — M25642 Stiffness of left hand, not elsewhere classified: Secondary | ICD-10-CM | POA: Diagnosis not present

## 2024-02-15 DIAGNOSIS — M25642 Stiffness of left hand, not elsewhere classified: Secondary | ICD-10-CM | POA: Diagnosis not present

## 2024-02-18 DIAGNOSIS — M25642 Stiffness of left hand, not elsewhere classified: Secondary | ICD-10-CM | POA: Diagnosis not present

## 2024-02-21 DIAGNOSIS — J4520 Mild intermittent asthma, uncomplicated: Secondary | ICD-10-CM | POA: Diagnosis not present

## 2024-02-21 DIAGNOSIS — G4733 Obstructive sleep apnea (adult) (pediatric): Secondary | ICD-10-CM | POA: Diagnosis not present

## 2024-02-21 DIAGNOSIS — J302 Other seasonal allergic rhinitis: Secondary | ICD-10-CM | POA: Diagnosis not present

## 2024-02-25 DIAGNOSIS — M25642 Stiffness of left hand, not elsewhere classified: Secondary | ICD-10-CM | POA: Diagnosis not present

## 2024-03-03 DIAGNOSIS — M25642 Stiffness of left hand, not elsewhere classified: Secondary | ICD-10-CM | POA: Diagnosis not present

## 2024-03-06 DIAGNOSIS — M25642 Stiffness of left hand, not elsewhere classified: Secondary | ICD-10-CM | POA: Diagnosis not present

## 2024-03-08 DIAGNOSIS — M25642 Stiffness of left hand, not elsewhere classified: Secondary | ICD-10-CM | POA: Diagnosis not present

## 2024-03-13 DIAGNOSIS — J4520 Mild intermittent asthma, uncomplicated: Secondary | ICD-10-CM | POA: Diagnosis not present

## 2024-03-13 DIAGNOSIS — E559 Vitamin D deficiency, unspecified: Secondary | ICD-10-CM | POA: Diagnosis not present

## 2024-03-13 DIAGNOSIS — G4733 Obstructive sleep apnea (adult) (pediatric): Secondary | ICD-10-CM | POA: Diagnosis not present

## 2024-03-16 ENCOUNTER — Encounter (HOSPITAL_BASED_OUTPATIENT_CLINIC_OR_DEPARTMENT_OTHER): Payer: Self-pay | Admitting: Internal Medicine

## 2024-03-21 DIAGNOSIS — M25642 Stiffness of left hand, not elsewhere classified: Secondary | ICD-10-CM | POA: Diagnosis not present

## 2024-03-24 DIAGNOSIS — M25642 Stiffness of left hand, not elsewhere classified: Secondary | ICD-10-CM | POA: Diagnosis not present

## 2024-03-28 DIAGNOSIS — R7303 Prediabetes: Secondary | ICD-10-CM | POA: Diagnosis not present

## 2024-03-28 DIAGNOSIS — Z113 Encounter for screening for infections with a predominantly sexual mode of transmission: Secondary | ICD-10-CM | POA: Diagnosis not present

## 2024-03-28 DIAGNOSIS — E559 Vitamin D deficiency, unspecified: Secondary | ICD-10-CM | POA: Diagnosis not present

## 2024-03-28 DIAGNOSIS — D649 Anemia, unspecified: Secondary | ICD-10-CM | POA: Diagnosis not present

## 2024-04-14 DIAGNOSIS — G4733 Obstructive sleep apnea (adult) (pediatric): Secondary | ICD-10-CM | POA: Diagnosis not present

## 2024-04-14 DIAGNOSIS — E559 Vitamin D deficiency, unspecified: Secondary | ICD-10-CM | POA: Diagnosis not present

## 2024-05-01 DIAGNOSIS — A084 Viral intestinal infection, unspecified: Secondary | ICD-10-CM | POA: Diagnosis not present

## 2024-05-01 DIAGNOSIS — R197 Diarrhea, unspecified: Secondary | ICD-10-CM | POA: Diagnosis not present

## 2024-05-01 DIAGNOSIS — N4889 Other specified disorders of penis: Secondary | ICD-10-CM | POA: Diagnosis not present

## 2024-05-01 DIAGNOSIS — R112 Nausea with vomiting, unspecified: Secondary | ICD-10-CM | POA: Diagnosis not present

## 2024-05-08 ENCOUNTER — Encounter (HOSPITAL_BASED_OUTPATIENT_CLINIC_OR_DEPARTMENT_OTHER): Admitting: Internal Medicine

## 2024-06-16 DIAGNOSIS — J302 Other seasonal allergic rhinitis: Secondary | ICD-10-CM | POA: Diagnosis not present

## 2024-06-16 DIAGNOSIS — G4733 Obstructive sleep apnea (adult) (pediatric): Secondary | ICD-10-CM | POA: Diagnosis not present

## 2024-06-16 DIAGNOSIS — E559 Vitamin D deficiency, unspecified: Secondary | ICD-10-CM | POA: Diagnosis not present

## 2024-06-27 NOTE — Procedures (Signed)
 error

## 2024-06-27 NOTE — Procedures (Signed)
 SABRA

## 2024-08-31 ENCOUNTER — Emergency Department (HOSPITAL_COMMUNITY)
Admission: EM | Admit: 2024-08-31 | Discharge: 2024-08-31 | Discharge status: Against Medical Advice | Attending: Emergency Medicine | Admitting: Emergency Medicine

## 2024-08-31 ENCOUNTER — Encounter (HOSPITAL_COMMUNITY): Payer: Self-pay | Admitting: Emergency Medicine

## 2024-08-31 DIAGNOSIS — R0789 Other chest pain: Secondary | ICD-10-CM | POA: Diagnosis not present

## 2024-08-31 DIAGNOSIS — J45909 Unspecified asthma, uncomplicated: Secondary | ICD-10-CM | POA: Insufficient documentation

## 2024-08-31 DIAGNOSIS — R0602 Shortness of breath: Secondary | ICD-10-CM | POA: Insufficient documentation

## 2024-08-31 DIAGNOSIS — Z5321 Procedure and treatment not carried out due to patient leaving prior to being seen by health care provider: Secondary | ICD-10-CM | POA: Diagnosis not present

## 2024-08-31 NOTE — ED Notes (Signed)
 Pt reports he took some medicine and is now feeling better and has decided to leave.

## 2024-08-31 NOTE — ED Triage Notes (Signed)
 Pt presents with increasing shob and wheezing with chest tightness.  Hx of asthma.  Started on prednisone  taper yesterday.  Had 40 mg yesterday and 40 mg today.  Pt reports using his inhaler but has no nebs at home.  When he laid down to sleep he began to panic feeling shob.

## 2024-09-12 ENCOUNTER — Ambulatory Visit (HOSPITAL_BASED_OUTPATIENT_CLINIC_OR_DEPARTMENT_OTHER): Attending: Family Medicine | Admitting: Internal Medicine

## 2024-09-12 DIAGNOSIS — G4733 Obstructive sleep apnea (adult) (pediatric): Secondary | ICD-10-CM

## 2024-09-19 ENCOUNTER — Ambulatory Visit (HOSPITAL_BASED_OUTPATIENT_CLINIC_OR_DEPARTMENT_OTHER): Attending: Internal Medicine | Admitting: Internal Medicine

## 2024-09-19 DIAGNOSIS — G4733 Obstructive sleep apnea (adult) (pediatric): Secondary | ICD-10-CM | POA: Diagnosis not present

## 2024-09-19 DIAGNOSIS — Z6841 Body Mass Index (BMI) 40.0 and over, adult: Secondary | ICD-10-CM | POA: Diagnosis not present

## 2024-10-08 NOTE — Procedures (Signed)
 [facility] [address] [place], [place] [address] Tel: [phone]   Fax: [phone]  Home Sleep Test Interpretation  Patient Name: Cory, Meza Study Date: 09/19/2024  Date of Birth: 04-08-2001 Study Type: HST  Age: 23 year MRN #: [id number]  Sex: Male Interpreting Physician: NEYSA RAMA, [id number]  Height: 5' 7 Referring Physician: Roanna, MD  Weight: 355.0 lbs Recording Tech: Holly Neeriemer RPSGT RST  BMI: 55.7 Scoring Tech: Holly Neeriemer RPSGT RST  ESS: 20 Neck Size: 18  %%startinterp%% %%startinterp%% Indications for Polysomnography The patient is a 23 year-old Male who is 5' 7 and weighs 355.0 lbs. His BMI equals 55.7.  A home sleep apnea test was performed to evaluate for -OSA  Medication  No Data.   Polysomnogram Data A home sleep test recorded the standard physiologic parameters including EKG, nasal and oral airflow.  Respiratory parameters of chest and abdominal movements were recorded with Respiratory Inductance Plethysmography belts.  Oxygen saturation was recorded by pulse oximetry.   Study Architecture The total recording time of the polysomnogram was 529.7 minutes.  The total monitoring time was 530.0 minutes.  Time spent in Supine position was 76.5 minutes.   Respiratory Events The study revealed a presence of 75 obstructive, 1 central, and - mixed apneas resulting in an Apnea index of 8.6 events per hour.  There were 334 hypopneas (>=3% desaturation and/or arousal) resulting in an Apnea\Hypopnea Index (AHI >=3% desaturation and/or arousal) of 46.4 events per hour.  There were 259 hypopneas (>=4% desaturation) resulting in an Apnea\Hypopnea Index (AHI >=4% desaturation) of 37.9 events per hour.  There were - Respiratory Effort Related Arousals resulting in a RERA index of - events per hour. The Respiratory Disturbance Index is 46.4 events per hour.  The snore index was 9.7 events per hour.  Mean oxygen saturation  was 89.8%.  The lowest oxygen saturation during monitoring time was 63.0%.  Time spent <=88% oxygen saturation was 119.1 minutes (24.4%).  Cardiac Summary The average pulse rate was 79.3 bpm.  The minimum pulse rate was 19.0 bpm while the maximum pulse rate was 207.0 bpm.    Comments: Severe obstructive sleep apnea, AHI (3%) 46.4/hr. Snoring with oxygen desaturation nadir 63%, mean 89.8%, indicating Nocturnal Hypoxemia.  Diagnosis: Obstructive sleep apnea, Nocturnal Hypoxemia  Recommendations:   CPAP titration sleep study would document for insurance if supplemental O2 and/or BIPAP would be needed. Otherwise consider autopap with follow up to ensure correction of hypoxemia.   This study was personally reviewed and electronically signed by: Rama Neysa, MD Accredited Board Certified in Sleep Medicine Date/Time: 10/08/24  12:25    %%endinterp%% %%endinterp%%    Study Overview  Recording Time: 540.3 min. Monitoring Time: 530.0 min.  Analysis Start:  11:44:38 PM Supine Time: 76.5 min.  Analysis Stop:  08:34:21 AM     Study Summary   Count Index Longest Event Duration  Apneas & Hypopneas: 410 46.4  Apneas: 36.6 sec.     Hypopneas: 43.4 sec.  RERAs: - - - sec.  Desaturations: 548 62.0 84.0 sec.  Snores: 86 9.7 7.9 sec.    Minimum Oxygen Saturation: 63.0%    Respiratory Summary   Total Duration Supine Non-Supine   Count Index Average Longest Count Index Count Index  Obstructive Apnea 75 8.5 14.2 36.6 1 0.8 74 9.8   Mixed Apnea - - - - - - - -   Central Apnea 1 0.1 24.8 24.8 - - 1 0.1   Total  Apneas 76 8.6 14.4 36.6 1 0.8 75 9.9            Hypopneas 3% 334 37.8 N.A. N.A. 120 94.1 214 28.3   Apneas & Hyp. 3% 410 46.4 N.A. N.A. 121 94.9 289 38.2            Hypopneas 4% 259 29.3 N.A. N.A. 97 76.1 162 21.4  Apneas & Hyp. 4% 335 37.9 N.A. N.A. 98 76.9 237 31.4             RERAs - - - - - - - -  RDI 427 48.3 N.A. N.A. 126 98.8 301 39.8   Oxygen Saturation Summary    Total Supine Non-Supine  Average SpO2 89.8% 90.4% 89.7%  Minimum SpO2 63.0% 71.0% 63.0%   Maximum SpO2 100.0% 99.0% 100.0%   Oxygen Saturation Distribution  Range (%) Time in range (min) Time in range (%)  90.0 - 100.0 302.8 62.1%  80.0 - 90.0 148.9 30.6%  70.0 - 80.0 35.1 7.2%  60.0 - 70.0 0.4 0.1%  50.0 - 60.0 - -  0.0 - 50.0 - -  Time Spent <=88% SpO2  Range (%) Time in range (min) Time in range (%)  0.0 - 88.0 119.1 24.4%  Cardiac Summary   Total Supine Non-Supine  Average Pulse Rate (BPM) 79.3 82.7 78.7  Minimum Pulse Rate (BPM) 19.0 47.0 19.0  Maximum Pulse Rate (BPM) 207.0 101.0 207.0                     Technologist Comments  -                          Reggy Neysa Bateman, Biomedical Engineer of Sleep Medicine  ELECTRONICALLY SIGNED ON:  10/08/2024, 12:20 PM [facility] PH: [phone]   FX: [phone] ACCREDITED BY THE [employer] OF SLEEP MEDIC[employer]

## 2024-10-27 ENCOUNTER — Encounter (HOSPITAL_BASED_OUTPATIENT_CLINIC_OR_DEPARTMENT_OTHER): Payer: Self-pay | Admitting: Internal Medicine

## 2024-10-27 DIAGNOSIS — G4733 Obstructive sleep apnea (adult) (pediatric): Secondary | ICD-10-CM

## 2024-12-13 ENCOUNTER — Emergency Department (HOSPITAL_COMMUNITY)
Admission: EM | Admit: 2024-12-13 | Discharge: 2024-12-14 | Disposition: A | Discharge status: Home / Self Care | Attending: Emergency Medicine | Admitting: Emergency Medicine

## 2024-12-13 ENCOUNTER — Other Ambulatory Visit: Payer: Self-pay

## 2024-12-13 ENCOUNTER — Emergency Department (HOSPITAL_COMMUNITY)

## 2024-12-13 ENCOUNTER — Encounter (HOSPITAL_COMMUNITY): Payer: Self-pay

## 2024-12-13 DIAGNOSIS — R509 Fever, unspecified: Secondary | ICD-10-CM | POA: Diagnosis present

## 2024-12-13 DIAGNOSIS — J101 Influenza due to other identified influenza virus with other respiratory manifestations: Secondary | ICD-10-CM | POA: Diagnosis not present

## 2024-12-13 LAB — RESP PANEL BY RT-PCR (RSV, FLU A&B, COVID)  RVPGX2
Influenza A by PCR: POSITIVE — AB
Influenza B by PCR: NEGATIVE
Resp Syncytial Virus by PCR: NEGATIVE
SARS Coronavirus 2 by RT PCR: NEGATIVE

## 2024-12-13 MED ORDER — ALBUTEROL SULFATE (2.5 MG/3ML) 0.083% IN NEBU
2.5000 mg | INHALATION_SOLUTION | Freq: Once | RESPIRATORY_TRACT | Status: AC
  Administered 2024-12-13: 2.5 mg via RESPIRATORY_TRACT
  Filled 2024-12-13: qty 3

## 2024-12-13 NOTE — ED Triage Notes (Signed)
 Pt arrives via POV. PT reports cough, chest tightness, and wheezing since last night. States it feels like an asthma attack. PT is AxOx4. Able to speak in complete sentences. NAD at this time.

## 2024-12-14 MED ORDER — ACETAMINOPHEN 500 MG PO TABS
1000.0000 mg | ORAL_TABLET | Freq: Once | ORAL | Status: AC
  Administered 2024-12-14: 1000 mg via ORAL
  Filled 2024-12-14: qty 2

## 2024-12-14 MED ORDER — IBUPROFEN 800 MG PO TABS
800.0000 mg | ORAL_TABLET | Freq: Once | ORAL | Status: AC
  Administered 2024-12-14: 800 mg via ORAL
  Filled 2024-12-14: qty 1

## 2024-12-14 MED ORDER — OSELTAMIVIR PHOSPHATE 75 MG PO CAPS: 75.0000 mg | ORAL_CAPSULE | Freq: Two times a day (BID) | ORAL | 0 refills | Status: AC

## 2024-12-14 NOTE — ED Provider Notes (Signed)
 " [facility] Provider Note   CSN: [id number] Arrival date & time: 12/13/24  8165     Patient presents with: Chest Pain, Cough, and Asthma   Cory T Plummer Matich. is a 24 y.o. male.   The history is provided by the patient.  Influenza Presenting symptoms: cough, fever and myalgias   Presenting symptoms: no sore throat and no vomiting   Severity:  Moderate Onset quality:  Gradual Duration:  1 day Progression:  Unchanged Chronicity:  New Relieved by:  Nothing Worsened by:  Nothing Ineffective treatments:  None tried Associated symptoms: chills and nasal congestion   Risk factors: no diabetes problem and no heart disease        Prior to Admission medications  Medication Sig Start Date End Date Taking? Authorizing Provider  oseltamivir  (TAMIFLU ) 75 MG capsule Take 1 capsule (75 mg total) by mouth every 12 (twelve) hours. 12/14/24  Yes Nettye Flegal, MD  albuterol  (VENTOLIN  HFA) 108 (90 Base) MCG/ACT inhaler Inhale 1-2 puffs into the lungs every 6 (six) hours as needed for wheezing or shortness of breath. 07/18/23   Mayer, Jodi R, NP  HYDROcodone -acetaminophen  (HYCET) 7.5-325 mg/15 ml solution 1 teaspoon every 6 hours for severe pain that is not relieved by Tylenol  or Motrin  alone 12/07/22   Zammit, Joseph, MD  olopatadine  (PATANOL) 0.1 % ophthalmic solution Place 1 drop into the right eye 2 (two) times daily. 03/11/23   Christopher Savannah, PA-C  tobramycin  (TOBREX ) 0.3 % ophthalmic solution Place 1 drop into the right eye every 4 (four) hours. 03/11/23   Christopher Savannah, PA-C    Allergies: Patient has no known allergies.    Review of Systems  Constitutional:  Positive for chills and fever.  HENT:  Positive for congestion. Negative for facial swelling and sore throat.   Respiratory:  Positive for cough. Negative for wheezing and stridor.   Gastrointestinal:  Negative for vomiting.  Musculoskeletal:  Positive for myalgias.  All other systems  reviewed and are negative.   Updated Vital Signs BP (!) 153/89 (BP Location: Right Arm)   Pulse (!) 113   Temp 100 F (37.8 C)   Resp 18   Ht 5' 7 (1.702 m)   Wt (!) 163.3 kg   SpO2 96%   BMI 56.38 kg/m   Physical Exam Vitals and nursing note reviewed.  Constitutional:      General: He is not in acute distress.    Appearance: Normal appearance. He is well-developed. He is not diaphoretic.  HENT:     Head: Normocephalic and atraumatic.     Nose: Nose normal.  Eyes:     Conjunctiva/sclera: Conjunctivae normal.     Pupils: Pupils are equal, round, and reactive to light.  Cardiovascular:     Rate and Rhythm: Normal rate and regular rhythm.     Pulses: Normal pulses.     Heart sounds: Normal heart sounds.  Pulmonary:     Effort: Pulmonary effort is normal.     Breath sounds: Normal breath sounds. No wheezing or rales.  Abdominal:     General: Bowel sounds are normal.     Palpations: Abdomen is soft.     Tenderness: There is no abdominal tenderness. There is no guarding or rebound.  Musculoskeletal:        General: Normal range of motion.     Cervical back: Normal range of motion and neck supple.  Skin:    General: Skin is warm and dry.  Capillary Refill: Capillary refill takes less than 2 seconds.  Neurological:     General: No focal deficit present.     Mental Status: He is alert and oriented to person, place, and time.     Deep Tendon Reflexes: Reflexes normal.  Psychiatric:        Mood and Affect: Mood normal.     (all labs ordered are listed, but only abnormal results are displayed) Labs Reviewed  RESP PANEL BY RT-PCR (RSV, FLU A&B, COVID)  [id number] - Abnormal; Notable for the following components:      Result Value   Influenza A by PCR POSITIVE (*)    All other components within normal limits    EKG: None  Radiology: DG Chest 2 View Result Date: 12/13/2024 EXAM: 2 VIEW(S) XRAY OF THE CHEST 12/13/2024 07:39:00 PM COMPARISON: 12/04/2021 CLINICAL  HISTORY: SOB FINDINGS: LUNGS AND PLEURA: No focal pulmonary opacity. No pleural effusion. No pneumothorax. HEART AND MEDIASTINUM: No acute abnormality of the cardiac and mediastinal silhouettes. BONES AND SOFT TISSUES: No acute osseous abnormality. IMPRESSION: 1. No acute process. Electronically signed by: Pinkie Pebbles MD 12/13/2024 07:42 PM EST RP Workstation: [id number]     Procedures   Medications Ordered in the ED  albuterol  (PROVENTIL ) (2.5 MG/3ML) 0.083% nebulizer solution 2.5 mg (2.5 mg Nebulization Given 12/13/24 1908)  acetaminophen  (TYLENOL ) tablet 1,000 mg (1,000 mg Oral Given 12/14/24 0108)  ibuprofen  (ADVIL ) tablet 800 mg (800 mg Oral Given 12/14/24 0108)                                    Medical Decision Making Patient with flu like illness but thought it was his asthma   Amount and/or Complexity of Data Reviewed External Data Reviewed: notes.    Details: Previous notes reviewed  Labs: ordered.    Details: Flu A is positive covid is negative  Radiology: ordered and independent interpretation performed.    Details: Negative CXR  Risk OTC drugs. Prescription drug management. Risk Details: Patient with influenza.  Not wheezing normal saturations.  Wells score 0.  Will start Tamiflu  due to underlying illness.  Stable for discharge.       Final diagnoses:  Influenza A    No signs of systemic illness or infection. The patient is nontoxic-appearing on exam and vital signs are within normal limits.  I have reviewed the triage vital signs and the nursing notes. Pertinent labs & imaging results that were available during my care of the patient were reviewed by me and considered in my medical decision making (see chart for details). After history, exam, and medical workup I feel the patient has been appropriately medically screened and is safe for discharge home. Pertinent diagnoses were discussed with the patient. Patient was given return precautions.  ED Discharge Orders           Ordered    oseltamivir  (TAMIFLU ) 75 MG capsule  Every 12 hours        12/14/24 0048               Rue Valladares, MD 12/14/24 0200  "

## 2024-12-21 ENCOUNTER — Ambulatory Visit (HOSPITAL_BASED_OUTPATIENT_CLINIC_OR_DEPARTMENT_OTHER): Attending: Family Medicine | Admitting: Pulmonary Disease

## 2024-12-21 DIAGNOSIS — G4733 Obstructive sleep apnea (adult) (pediatric): Secondary | ICD-10-CM
